# Patient Record
Sex: Male | Born: 1980 | Hispanic: Yes | Marital: Married | Smoking: Never smoker
Health system: Southern US, Community
[De-identification: ages and names within clinical notes are randomized; demographics above are authoritative.]

---

## 2000-05-25 ENCOUNTER — Emergency Department (HOSPITAL_COMMUNITY): Admission: EM | Admit: 2000-05-25 | Discharge: 2000-05-25 | Payer: Self-pay | Admitting: *Deleted

## 2011-07-07 ENCOUNTER — Ambulatory Visit: Payer: Self-pay | Admitting: Physician Assistant

## 2011-07-07 VITALS — BP 113/71 | HR 66 | Temp 99.0°F | Resp 16 | Ht 64.0 in | Wt 149.0 lb

## 2011-07-07 DIAGNOSIS — L255 Unspecified contact dermatitis due to plants, except food: Secondary | ICD-10-CM

## 2011-07-07 MED ORDER — PREDNISONE 10 MG PO TABS: ORAL_TABLET | ORAL | Status: DC

## 2011-07-07 MED ORDER — METHYLPREDNISOLONE ACETATE 80 MG/ML IJ SUSP
80.0000 mg | Freq: Once | INTRAMUSCULAR | Status: AC
  Administered 2011-07-07: 80 mg via INTRAMUSCULAR

## 2011-07-07 NOTE — Patient Instructions (Addendum)
If your rash gets bigger, more painful or you get fever, call or return to the clinic. You can use Claritin for itching Clean area with soap and water    Poison Ivy Poison ivy is a inflammation of the skin (contact dermatitis) caused by touching the allergens on the leaves of the ivy plant following previous exposure to the plant. The rash usually appears 48 hours after exposure. The rash is usually bumps (papules) or blisters (vesicles) in a linear pattern. Depending on your own sensitivity, the rash may simply cause redness and itching, or it may also progress to blisters which may break open. These must be well cared for to prevent secondary bacterial (germ) infection, followed by scarring. Keep any open areas dry, clean, dressed, and covered with an antibacterial ointment if needed. The eyes may also get puffy. The puffiness is worst in the morning and gets better as the day progresses. This dermatitis usually heals without scarring, within 2 to 3 weeks without treatment. HOME CARE INSTRUCTIONS  Thoroughly wash with soap and water as soon as you have been exposed to poison ivy. You have about one half hour to remove the plant resin before it will cause the rash. This washing will destroy the oil or antigen on the skin that is causing, or will cause, the rash. Be sure to wash under your fingernails as any plant resin there will continue to spread the rash. Do not rub skin vigorously when washing affected area. Poison ivy cannot spread if no oil from the plant remains on your body. A rash that has progressed to weeping sores will not spread the rash unless you have not washed thoroughly. It is also important to wash any clothes you have been wearing as these may carry active allergens. The rash will return if you wear the unwashed clothing, even several days later. Avoidance of the plant in the future is the best measure. Poison ivy plant can be recognized by the number of leaves. Generally, poison ivy  has three leaves with flowering branches on a single stem. Diphenhydramine may be purchased over the counter and used as needed for itching. Do not drive with this medication if it makes you drowsy.Ask your caregiver about medication for children. SEEK MEDICAL CARE IF:  Open sores develop.   Redness spreads beyond area of rash.   You notice purulent (pus-like) discharge.   You have increased pain.   Other signs of infection develop (such as fever).  Document Released: 12/30/1999 Document Revised: 12/21/2010 Document Reviewed: 11/17/2008 [facility] Patient Information 2012 [place], [place].

## 2011-07-08 NOTE — Progress Notes (Signed)
  Subjective:    Patient ID: Nicholas Simon, male    DOB: 06-Jan-1981, 31 y.o.   MRN: [id number]  HPI Nicholas Simon was working in his yard and came in contact with poison ivy. He had rashes on his forearms but they are better.  He now has a rash on his right medial lower leg for several days that is very red itchy and with small bumps.  It is not painful.  He applied a topical treatment but is not sure what it was.  He has had no fever or chills, streaking or drainage from the rash.   Review of Systems As noted in HPI, otherwise negative     Objective:   Physical Exam  Bilateral forearms with excoriated healing scattered linear lesions Right medial lower leg with large red area that is not indurated and tender with small papulovesicular lesions.      Assessment & Plan:  Contact dermatitis  Depo Medrol 80 mg IM now Prednisone 10 mg taper Claritin Watch for increasing redness, pain, streaking fever

## 2013-05-20 ENCOUNTER — Ambulatory Visit: Payer: Self-pay | Admitting: Family Medicine

## 2013-05-20 VITALS — BP 120/74 | HR 99 | Temp 98.3°F | Resp 16 | Ht 63.5 in | Wt 144.8 lb

## 2013-05-20 DIAGNOSIS — K4090 Unilateral inguinal hernia, without obstruction or gangrene, not specified as recurrent: Secondary | ICD-10-CM

## 2013-05-20 MED ORDER — TRAMADOL HCL 50 MG PO TABS: 50.0000 mg | ORAL_TABLET | Freq: Three times a day (TID) | ORAL | Status: AC | PRN

## 2013-05-20 NOTE — Progress Notes (Signed)
Subjective:    Patient ID: Nicholas Simon, male    DOB: 1980-02-10, 33 y.o.   MRN: [id number] This chart was scribed for Norberto SorensonEva Shaw, MD by Nicholos Johnsenise Iheanachor, Medical Scribe. This patient's care was started at 7:56 PM.  Chief Complaint  Patient presents with  . Groin Pain    x 2 weeks   Groin Pain The patient's primary symptoms include testicular pain. The patient's pertinent negatives include no penile pain or scrotal swelling. Pertinent negatives include no abdominal pain, chills, constipation, fever, nausea, rash or vomiting.   HPI Comments: Nicholas Simon is a 33 y.o. male who presents to [facility] complaining intermittent groin and right testicular pain, onset about 2 weeks ago. Pain worse with heavy lifting since sx onset. States he has to balance his weight to one side when lifting to avoid recreating pain. 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 States he had same pain 8 years ago and received medication that helped to resolve pain. Denies rash, nausea, vomiting, constipation, or abdominal pain.  Pt speaks some English. His daughter helped translate.  There are no active problems to display for this patient.  No past medical history on file. No past surgical history on file. No Known Allergies Prior to Admission medications   Medication Sig Start Date End Date Taking? Authorizing Provider  predniSONE (DELTASONE) 10 MG tablet 6,5,4,3,2,1 taper 07/07/11   Pattricia BossAlicia G Warrick, PA-C   History   Social History  . Marital Status: Married    Spouse Name: N/A    Number of Children: N/A  . Years of Education: N/A   Occupational History  . Not on file.   Social History Main Topics  . Smoking status: Current Every Day Smoker  . Smokeless tobacco: Not on file  . Alcohol Use: Yes  . Drug Use: No  . Sexual Activity: Not on file   Other Topics Concern  . Not on file   Social History Narrative  . No narrative on file   Review of Systems    Constitutional: Negative for fever, chills, activity change and appetite change.  Gastrointestinal: Negative for nausea, vomiting, abdominal pain and constipation.  Genitourinary: Positive for testicular pain. Negative for penile swelling, scrotal swelling, difficulty urinating and penile pain.  Skin: Negative for rash.   BP 120/74  Pulse 99  Temp(Src) 98.3 F (36.8 C) (Oral)  Resp 16  Ht 5' 3.5" (1.613 m)  Wt 144 lb 12.8 oz (65.681 kg)  BMI 25.24 kg/m2  SpO2 98% Objective:  Physical Exam  Vitals reviewed. Constitutional: He is oriented to person, place, and time. He appears well-developed and well-nourished. No distress.  HENT:  Head: Normocephalic and atraumatic.  Eyes: EOM are normal.  Neck: Neck supple.  Cardiovascular: Normal rate.   Pulmonary/Chest: Effort normal. No respiratory distress.  Abdominal: A hernia is present. Hernia confirmed positive in the right inguinal area. Hernia confirmed negative in the left inguinal area.  Genitourinary: Testes normal and penis normal. Right testis shows no mass, no swelling and no tenderness. No penile erythema.  Small right inguinal hernia palpable with valsava. Defect seems to be approx 1 cm.  Musculoskeletal: Normal range of motion.  Lymphadenopathy:    He has no cervical adenopathy.       Right: No inguinal adenopathy present.       Left: No inguinal adenopathy present.  Neurological: He is alert and oriented to person, place, and time.  Skin: Skin is warm and dry.  Psychiatric: He has a normal mood and affect. His behavior is normal.   Assessment & Plan:   Discussed with pt the best method to treat a hernia is through surgical repair. Unfortunately, pt w/o insurance so not interested in repair at this time.  Can try truss to alleviate sxs.  Warned of dangers of incarceration/strangluation - RTC or ER if occurs. Can call us for referral if he chooses to follow through with surgery or if pain worsens. Inguinal hernia  -  right  Meds ordered this encounter  Medications  . traMADol (ULTRAM) 50 MG tablet    Sig: Take 1 tablet (50 mg total) by mouth every 8 (eight) hours as needed.    Dispense:  30 tablet    Refill:  1    I personally performed the services described in this documentation, which was scribed in my presence. The recorded information has been reviewed and considered, and addended by me as needed.  Norberto SorensonEva Shaw, MD MPH

## 2013-05-20 NOTE — Patient Instructions (Addendum)
You may want to try using a hernia belt (also called a hernia truss) for your right inguinal hernia - you should probably be able to find one of these at [employer] or the pharmacy but can DEFINITELY find one on-line.  These can help prevent the pain when you are working by keeping the hernia from bulging out when you lift heavy things.  However, it will not make the hernia go away and normally overtime these hernias get worse - tend to grow larger and more painful. Surgery is the only way to fix it.  If it ever becomes VERY painful and doesn't get better when you lay down, make sure you come in to clinic immediately for evaluation as if the hernia gets stuck it is an emergency and could become life-threatening if not treated quickly.  You can use aleve or tylenol (generics are best) as needed for pain or use the prescription pain medication when pain is bad - be careful as pain medicine can make you drowsy or lightheaded.  Hernia inguinal - Adultos  (Inguinal Hernia, Adult)  Los msculos mantienen todos los rganos del cuerpo en Financial controllerel lugar correcto. Pero si se produce un punto dbil Valero Energyentre los msculos, algunos pueden protruir. Eso se llama hernia. Cuando esto sucede en la parte inferior del vientre (abdomen), se trata de una hernia inguinal. (Toma su nombre de una parte del cuerpo que en esta regin se llamada canal inguinal). Un punto dbil en la pared de los msculos deja que un poco de grasa o parte del intestino delgado salgan hacia afuera. Una hernia inguinal puede desarrollarse a cualquier edad. Los hombres la sufren con ms frecuencia que las mujeres.  CAUSAS  En los adultos, la hernia inguinal desarrolla con el tiempo.   Las causas pueden ser:  Un esfuerzo sbito de los msculos de la parte inferior del abdomen.  Levantar objetos pesados.  Dificultad para mover el intestino. La dificultad para mover el intestino (constipacin) puede llevar a una hernia.  Tos constante. La causa puede ser el  tabaquismo o una enfermedad pulmonar.  Tener sobrepeso.  El Carrolltonembarazo.  Tener un empleo que requiera Location managerpermanecer largos perodos de pie o levantar objetos pesados.  Haber sufrido de una hernia inguinal anteriormente. En algunos casos puede convertirse en una situacin de Associate Professoremergencia. Cuando esto ocurre, se llama hernia inguinal estrangulada. Se produce cuando una parte del intestino delgado se desliza a travs del punto dbil y no puede volver al abdomen. El flujo de Alvinsangre puede interrumpirse. Si esto ocurre, una parte del intestino puede morir. Esta situacin requiere [place] Herz[place]una ciruga de Luxembourgurgencia.  SNTOMAS  Generalmente una hernia inguinal pequea no tiene sntomas. Se diagnostica cuando un profesional de la salud hace un examen fsico. Las hernias ms grandes generalmente presentan sntomas.   En los adultos, los sntomas incluyen:  Un bulto en la ingle. Es fcil de Engineer, manufacturingdetectar cuando la persona est de pie. Puede desaparecer al Javier Glazierestar acostado.  Los hombres pueden tener un bulto Proofreaderen el escroto.  Dolor o ardor en la ingle. Esto ocurre especialmente al levantar objetos, realizar un esfuerzo o toser.  Dolor sordo o sensacin de presin en la ingle.  Los signos de una hernia estrangulada pueden ser:  Neomia DearUna protuberancia en la ingle que duele mucho y est sensible al tacto.  Un bulto que se vuelve de color rojo o prpura.  Grant RutsFiebre, nuseas y vmitos.  Imposibilidad de evacuar el intestino o de eliminar gases. DIAGNSTICO  Para diagnosticar una hernia inguinal, el  profesional le har un examen fsico.   Incluir preguntas acerca de los sntomas que haya notado.  El mdico palpar el rea de la ingle y le pedir que tosa. Si palpa una hernia inguinal, el mdico podr tratar de deslizarla de nuevo hacia adentro el abdomen.  Por lo general no se necesitan otros estudios. TRATAMIENTO  Los tratamientos IT consultantpueden variar. Dependern del tamao de la hernia. Las opciones incluyen:   Observacin  cuidadosa. Esto a menudo se sugiere si la hernia es pequea y usted no ha tenido sntomas.  No se realizar ningn procedimiento mdico excepto que aparezcan sntomas.  Tendr que prestar atencin a los sntomas. Si tiene sntomas, comunquese con su mdico de inmediato.  Ciruga. Se realiza si la hernia es grande o si tiene sntomas.  Ciruga abierta. Por lo general, este es un procedimiento ambulatorio (no tendr que pasar la noche en el hospital). Se realiza un corte (incisin) a travs de la piel de la ingle. La hernia se vuelve a colocar en el interior del abdomen. Luego se repara la zona dbil en los msculos con una herniorrafia o hernioplastia. Herniorrafa: en este tipo de Azerbaijanciruga, se suturan juntos los msculos dbiles. Hernioplasta: se coloca un parche o malla para cerrar el rea dbil en la pared abdominal.  Laparoscopia. En este procedimiento, el cirujano hace incisiones pequeas. Se coloca en el abdomen un tubo delgado con una pequea cmara de video (llamado laparoscopio). El cirujano repara la hernia con Pagelanduna malla, observando en una cmara de vdeo y [address]izando dos instrumentos largos. INSTRUCCIONES PARA EL CUIDADO EN EL HOGAR   Despus de la ciruga de reparacin de una hernia inguinal:  Necesitar tomar un analgsico para el dolor recetado por su mdico. Siga cuidadosamente todas las indicaciones.  Tendr que cuidar la herida de la incisin.  Deber restringir algunas actividades por un tiempo. Incluir no levantar objetos pesados   durante varias semanas. Tampoco podr hacer nada demasiado activo durante algunas semanas. La vuelta al Aleen Campitrabajo depender del tipo de trabajo que tenga.  Durante perodos de "espera vigilante", usted debe:  Mantenga un peso saludable.  Consumir una dieta rica en fibra (frutas, verduras y granos enteros).  Beba gran cantidad de lquidos para evitar la constipacin. Esto significa beber suficiente agua y otros lquidos para mantener la orina  clara o de color amarillo plido.  No levante objetos pesados.  No permanezca de pie durante largos perodos.  Deje de fumar. Evite toser con frecuencia. SOLICITE ATENCIN MDICA SI:   Aparece una protuberancia en el rea de la ingle.  Siente dolor, tiene sensacin de Livingstonquemazn o de presin en la ingle. Esto podra empeorar si levanta pesos o hace esfuerzos.  Tiene fiebre de ms de 100.5 F (38.1 C). SOLICITE ATENCIN MDICA DE INMEDIATO SI:   El dolor en la ingle aumenta repentinamente.  Una protuberancia en la ingle se hace ms grande y no baja.  En los hombres, un dolor repentino en el escroto. O el escroto aumenta de tamao.  Un bulto en el rea de la ingle se vuelve de color rojo o prpura y es dolorosa al tacto.  Tiene nuseas o vmitos que no desaparecen.  Siente que su corazn late mucho ms rpido de lo normal.  No puede mover el intestino o eliminar gases.  Tiene fiebre de ms de 102.0 F (38.9 C). Document Released: 04/28/2012 [facility] Patient Information 2014 [place], [place].

## 2015-04-01 ENCOUNTER — Ambulatory Visit (INDEPENDENT_AMBULATORY_CARE_PROVIDER_SITE_OTHER): Payer: Self-pay

## 2015-04-01 ENCOUNTER — Ambulatory Visit (INDEPENDENT_AMBULATORY_CARE_PROVIDER_SITE_OTHER): Payer: Self-pay | Admitting: Family Medicine

## 2015-04-01 VITALS — BP 120/80 | HR 62 | Temp 98.5°F | Resp 18 | Ht 64.25 in | Wt 154.8 lb

## 2015-04-01 DIAGNOSIS — R1030 Lower abdominal pain, unspecified: Secondary | ICD-10-CM

## 2015-04-01 DIAGNOSIS — R1031 Right lower quadrant pain: Secondary | ICD-10-CM

## 2015-04-01 LAB — POCT URINALYSIS DIP (MANUAL ENTRY)
Bilirubin, UA: NEGATIVE
Blood, UA: NEGATIVE
GLUCOSE UA: NEGATIVE
Ketones, POC UA: NEGATIVE
Leukocytes, UA: NEGATIVE
NITRITE UA: NEGATIVE
PROTEIN UA: NEGATIVE
Spec Grav, UA: 1.01
UROBILINOGEN UA: 0.2
pH, UA: 7

## 2015-04-01 LAB — POCT CBC
Granulocyte percent: 61.3 %G (ref 37–80)
HCT, POC: 42.8 % — AB (ref 43.5–53.7)
Hemoglobin: 16.3 g/dL (ref 14.1–18.1)
LYMPH, POC: 2 (ref 0.6–3.4)
MCH, POC: 32.7 pg — AB (ref 27–31.2)
MCHC: 38 g/dL — AB (ref 31.8–35.4)
MCV: 85.9 fL (ref 80–97)
MID (CBC): 0.5 (ref 0–0.9)
MPV: 6.7 fL (ref 0–99.8)
PLATELET COUNT, POC: 221 10*3/uL (ref 142–424)
POC Granulocyte: 3.9 (ref 2–6.9)
POC LYMPH %: 30.5 % (ref 10–50)
POC MID %: 8.2 %M (ref 0–12)
RBC: 4.97 M/uL (ref 4.69–6.13)
RDW, POC: 12.4 %
WBC: 6.4 10*3/uL (ref 4.6–10.2)

## 2015-04-01 LAB — POC MICROSCOPIC URINALYSIS (UMFC): MUCUS RE: ABSENT

## 2015-04-01 NOTE — Progress Notes (Signed)
Patient ID: Nicholas Simon, male    DOB: December 06, 1980  Age: 34 y.o. MRN: [id number]  Chief Complaint  Patient presents with  . Groin Pain    Right side x1 month on and off.     Subjective:   2 years ago the patient was here with right groin pain, thought to have a very tiny inguinal hernia. Things did not bother him for the last couple of years much until the last month. Having pain in the right lower abdomen just between McBurney's point in the umbilicus. He said the pain radiates down to the right side of the groin or right upper medial thigh. He is a Education administrator, does a good deal of physical activity, but with careful lifting and straining most of the time it is been okay. Over the last week it has gradually gotten worse. No fever. No nausea or vomiting. No problems with his bowels or urine.  Current allergies, medications, problem list, past/family and social histories reviewed.  Objective:  BP 120/80 mmHg  Pulse 62  Temp(Src) 98.5 F (36.9 C) (Oral)  Resp 18  Ht 5' 4.25" (1.632 m)  Wt 154 lb 12.8 oz (70.217 kg)  BMI 26.36 kg/m2  SpO2 100%  No major distress. Abdomen is soft without masses. Is tender just above right and below the umbilicus. The pain seems consistent on palpation. No tenderness in the groin. Mild weakness of the right inguinal ring. To the left but no true hernia. Testicle appears normal. Normal male uncircumcised external genitalia. Thigh nontender.  Assessment & Plan:   Assessment: 1. RLQ abdominal pain   2. Right groin pain       Plan: CBC, urine, and x-rays  Orders Placed This Encounter  Procedures  . DG Abd 2 Views    Order Specific Question:  Reason for Exam (SYMPTOM  OR DIAGNOSIS REQUIRED)    Answer:  rlq pain medial to mcburney's point past month    Order Specific Question:  Preferred imaging location?    Answer:  External  . POCT CBC  . POCT urinalysis dipstick  . POCT Microscopic Urinalysis ([facility])   Results for orders placed or performed in  visit on 04/01/15  POCT CBC  Result Value Ref Range   WBC 6.4 4.6 - 10.2 K/uL   Lymph, poc 2.0 0.6 - 3.4   POC LYMPH PERCENT 30.5 10 - 50 %L   MID (cbc) 0.5 0 - 0.9   POC MID % 8.2 0 - 12 %M   POC Granulocyte 3.9 2 - 6.9   Granulocyte percent 61.3 37 - 80 %G   RBC 4.97 4.69 - 6.13 M/uL   Hemoglobin 16.3 14.1 - 18.1 g/dL   HCT, POC 40.9 (A) 81.1 - 53.7 %   MCV 85.9 80 - 97 fL   MCH, POC 32.7 (A) 27 - 31.2 pg   MCHC 38.0 (A) 31.8 - 35.4 g/dL   RDW, POC 91.4 %   Platelet Count, POC 221 142 - 424 K/uL   MPV 6.7 0 - 99.8 fL  POCT urinalysis dipstick  Result Value Ref Range   Color, UA yellow yellow   Clarity, UA clear clear   Glucose, UA negative negative   Bilirubin, UA negative negative   Ketones, POC UA negative negative   Spec Grav, UA 1.010    Blood, UA negative negative   pH, UA 7.0    Protein Ur, POC negative negative   Urobilinogen, UA 0.2    Nitrite, UA Negative Negative  Leukocytes, UA Negative Negative  POCT Microscopic Urinalysis ([facility])  Result Value Ref Range   WBC,UR,HPF,POC None None WBC/hpf   RBC,UR,HPF,POC None None RBC/hpf   Bacteria None None, Too numerous to count   Mucus Absent Absent   Epithelial Cells, UR Per Microscopy None None, Too numerous to count cells/hpf   X-ray of abdomen appears normal. Radiology reading is pending. No orders of the defined types were placed in this encounter.    He is having pain, but I cannot find a distinct etiology. If he keeps having pain we will need to do further evaluation with a CT scan of the abdomen and pelvis. He is to return in about 3 days if not improving.     Patient Instructions   It is unclear why you keep having pain in that area. It may be because you are a little constipated (stool may be backed up on you).  Take MiraLAX 1 dose daily until stools are on the loose side  If pain continues to persist over the next 3 or 4 days we will probably need to do a CT scan of your abdomen and pelvis. If  you're getting at all worse at any time please come in sooner.    IF you received an x-ray today, you will receive an invoice from [facility]. Please contact [facility] at [phone] with questions or concerns regarding your invoice.   IF you received labwork today, you will receive an invoice from [facility]. Please contact [facility] at [phone] with questions or concerns regarding your invoice.   Our billing staff will not be able to assist you with questions regarding bills from these companies.  You will be contacted with the lab results as soon as they are available. The fastest way to get your results is to activate your My Chart account. Instructions are located on the last page of this paperwork. If you have not heard from us regarding the results in 2 weeks, please contact this office.          No Follow-up on file.   Layia Walla, MD 04/01/2015

## 2015-04-01 NOTE — Patient Instructions (Addendum)
It is unclear why you keep having pain in that area. It may be because you are a little constipated (stool may be backed up on you).  Take MiraLAX 1 dose daily until stools are on the loose side  If pain continues to persist over the next 3 or 4 days we will probably need to do a CT scan of your abdomen and pelvis. If you're getting at all worse at any time please come in sooner.    IF you received an x-ray today, you will receive an invoice from [facility]. Please contact [facility] at [phone] with questions or concerns regarding your invoice.   IF you received labwork today, you will receive an invoice from [facility]. Please contact [facility] at [phone] with questions or concerns regarding your invoice.   Our billing staff will not be able to assist you with questions regarding bills from these companies.  You will be contacted with the lab results as soon as they are available. The fastest way to get your results is to activate your My Chart account. Instructions are located on the last page of this paperwork. If you have not heard from us regarding the results in 2 weeks, please contact this office.

## 2015-04-08 ENCOUNTER — Telehealth: Payer: Self-pay

## 2015-04-08 NOTE — Telephone Encounter (Signed)
Pt  Has questions about tramadal not sure exactly what he needs hard to understand language barrier  Best number [phone]

## 2015-04-12 NOTE — Telephone Encounter (Signed)
Pt states he did not get Tramadol. Were you wanting to Rx this?

## 2015-04-14 NOTE — Telephone Encounter (Signed)
LM advising pt to RTC.

## 2015-04-14 NOTE — Telephone Encounter (Signed)
Left message for pt to call back  °

## 2015-04-14 NOTE — Telephone Encounter (Signed)
Call, If still having pains he needs to come back for a recheck to decide if a scan is needed.  I do not think it is wise to just give pain pills to hide the pain without further assessment. Have patient return to be seen by one of us.

## 2015-06-27 ENCOUNTER — Ambulatory Visit (INDEPENDENT_AMBULATORY_CARE_PROVIDER_SITE_OTHER): Payer: Self-pay | Admitting: Physician Assistant

## 2015-06-27 VITALS — BP 118/70 | HR 64 | Temp 98.0°F | Resp 18 | Ht 64.5 in | Wt 154.0 lb

## 2015-06-27 DIAGNOSIS — N50819 Testicular pain, unspecified: Secondary | ICD-10-CM

## 2015-06-27 LAB — POCT URINALYSIS DIP (MANUAL ENTRY)
BILIRUBIN UA: NEGATIVE
BILIRUBIN UA: NEGATIVE
Blood, UA: NEGATIVE
Glucose, UA: NEGATIVE
Leukocytes, UA: NEGATIVE
Nitrite, UA: NEGATIVE
PH UA: 5.5
PROTEIN UA: NEGATIVE
SPEC GRAV UA: 1.025
Urobilinogen, UA: 0.2

## 2015-06-27 LAB — POC MICROSCOPIC URINALYSIS (UMFC): MUCUS RE: ABSENT

## 2015-06-27 NOTE — Progress Notes (Signed)
   06/27/2015 12:35 PM   DOB: 1980/09/18 / MRN: [id number]  SUBJECTIVE:  Nicholas Simon is a 35 y.o. male presenting for   He has No Known Allergies.   He  has no past medical history on file.    He  reports that he has been smoking.  He does not have any smokeless tobacco history on file. He reports that he drinks alcohol. He reports that he does not use illicit drugs. He  has no sexual activity history on file. The patient  has no past surgical history on file.  His family history is not on file.  ROS  Problem list and medications reviewed and updated by myself where necessary, and exist elsewhere in the encounter.   OBJECTIVE:  BP 118/70 mmHg  Pulse 64  Temp(Src) 98 F (36.7 C) (Oral)  Resp 18  Ht 5' 4.5" (1.638 m)  Wt 154 lb (69.854 kg)  BMI 26.04 kg/m2  SpO2 100%  Physical Exam  No results found for this or any previous visit (from the past 72 hour(s)).  No results found.  ASSESSMENT AND PLAN  There are no diagnoses linked to this encounter.  The patient was advised to call or return to clinic if he does not see an improvement in symptoms or to seek the care of the closest emergency department if he worsens with the above plan.   Deliah BostonMichael Sholom Dulude, MHS, PA-C [facility] 06/27/2015 12:35 PM

## 2015-06-27 NOTE — Progress Notes (Signed)
06/27/2015 1:15 PM   DOB: [birth year] / MRN: [id number]  SUBJECTIVE:  Nicholas Simon is a 35 y.o. male presenting for right sided testicular irritation for the last 3 months.  Reports that he can feel "a bump on my egg." He denies dysuria, urgency, frequency, penile discharge. No new partners.   Denies weight loss, night sweats, fever, and is eating well at this time.  He is a self pay patient.    He has No Known Allergies.   He  has no past medical history on file.    He  reports that he has been smoking.  He does not have any smokeless tobacco history on file. He reports that he drinks alcohol. He reports that he does not use illicit drugs. He  has no sexual activity history on file. The patient  has no past surgical history on file.  His family history is not on file.  Review of Systems  Constitutional: Negative for fever, chills and diaphoresis.  Skin: Negative for rash.  Neurological: Negative for headaches.    Problem list and medications reviewed and updated by myself where necessary, and exist elsewhere in the encounter.   OBJECTIVE:  BP 118/70 mmHg  Pulse 64  Temp(Src) 98 F (36.7 C) (Oral)  Resp 18  Ht 5' 4.5" (1.638 m)  Wt 154 lb (69.854 kg)  BMI 26.04 kg/m2  SpO2 100%  Physical Exam  Constitutional: He is oriented to person, place, and time. He appears well-developed. He does not appear ill.  Eyes: Conjunctivae and EOM are normal. Pupils are equal, round, and reactive to light.  Cardiovascular: Normal rate and regular rhythm.   Pulmonary/Chest: Effort normal and breath sounds normal.  Abdominal: He exhibits no distension.  Genitourinary:     Musculoskeletal: Normal range of motion.  Neurological: He is alert and oriented to person, place, and time. No cranial nerve deficit. Coordination normal.  Skin: Skin is warm and dry. He is not diaphoretic.  Psychiatric: He has a normal mood and affect.  Nursing note and vitals reviewed.   Results for orders placed  or performed in visit on 06/27/15 (from the past 72 hour(s))  POCT urinalysis dipstick     Status: None   Collection Time: 06/27/15  1:12 PM  Result Value Ref Range   Color, UA yellow yellow   Clarity, UA clear clear   Glucose, UA negative negative   Bilirubin, UA negative negative   Ketones, POC UA negative negative   Spec Grav, UA 1.025    Blood, UA negative negative   pH, UA 5.5    Protein Ur, POC negative negative   Urobilinogen, UA 0.2    Nitrite, UA Negative Negative   Leukocytes, UA Negative Negative  POCT Microscopic Urinalysis ([facility])     Status: None   Collection Time: 06/27/15  1:12 PM  Result Value Ref Range   WBC,UR,HPF,POC None None WBC/hpf   RBC,UR,HPF,POC None None RBC/hpf   Bacteria None None, Too numerous to count   Mucus Absent Absent   Epithelial Cells, UR Per Microscopy None None, Too numerous to count cells/hpf    No results found.  ASSESSMENT AND PLAN  [name] was seen today for groin pain.  Diagnoses and all orders for this visit:  Testicular discomfort: He is concerned and has had a similar complaint in the past. Will obtain and non stat US to verify there is no abnormality.  -     POCT urinalysis dipstick -     POCT  Microscopic Urinalysis ([facility]) -     US Art/Ven Flow Abd Pelv Doppler; Future -     US Scrotum; Future    The patient was advised to call or return to clinic if he does not see an improvement in symptoms or to seek the care of the closest emergency department if he worsens with the above plan.   Deliah BostonMichael Alger Kerstein, MHS, PA-C [facility] 06/27/2015 1:15 PM

## 2015-06-27 NOTE — Patient Instructions (Signed)
     IF you received an x-ray today, you will receive an invoice from [facility]. Please contact [facility] at [phone] with questions or concerns regarding your invoice.   IF you received labwork today, you will receive an invoice from [facility]. Please contact [facility] at [phone] with questions or concerns regarding your invoice.   Our billing staff will not be able to assist you with questions regarding bills from these companies.  You will be contacted with the lab results as soon as they are available. The fastest way to get your results is to activate your My Chart account. Instructions are located on the last page of this paperwork. If you have not heard from us regarding the results in 2 weeks, please contact this office.      

## 2015-07-05 ENCOUNTER — Ambulatory Visit
Admission: RE | Admit: 2015-07-05 | Discharge: 2015-07-05 | Disposition: A | Discharge status: Home / Self Care | Payer: No Typology Code available for payment source | Source: Ambulatory Visit | Attending: Physician Assistant | Admitting: Physician Assistant

## 2015-07-05 DIAGNOSIS — N50819 Testicular pain, unspecified: Secondary | ICD-10-CM

## 2015-07-11 ENCOUNTER — Other Ambulatory Visit: Payer: Self-pay | Admitting: Physician Assistant

## 2015-07-11 DIAGNOSIS — N5089 Other specified disorders of the male genital organs: Secondary | ICD-10-CM

## 2017-01-22 IMAGING — US US ART/VEN ABD/PELV/SCROTUM DOPPLER LTD
1 series · 14 of 25 positions shown · non-contrast
Comparison: None in PACs

CLINICAL DATA: Testicular discomfort and irritation and right
scrotal mass

EXAM:
SCROTAL ULTRASOUND
DOPPLER ULTRASOUND OF THE TESTICLES
TECHNIQUE: Complete ultrasound examination of the testicles, epididymis, and
other scrotal structures was performed. Color and spectral Doppler
ultrasound were also utilized to evaluate blood flow to the
testicles.

[Series 1: us art/ven abd/pelv/scrotum doppler ltd · 0.06mm/px · 14 of 51 slices shown]
[im 1/51]
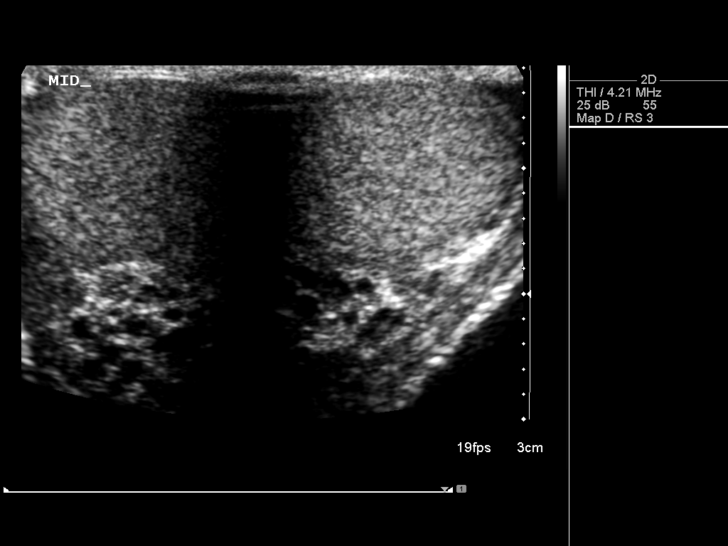
[im 5/51]
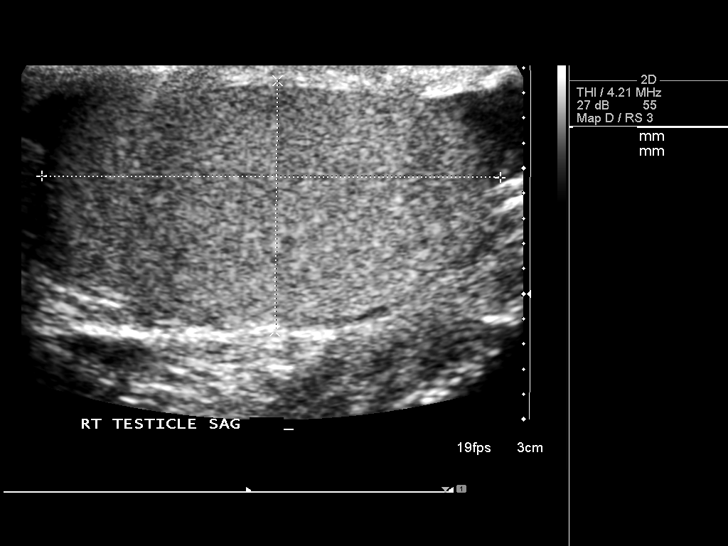
[im 9/51]
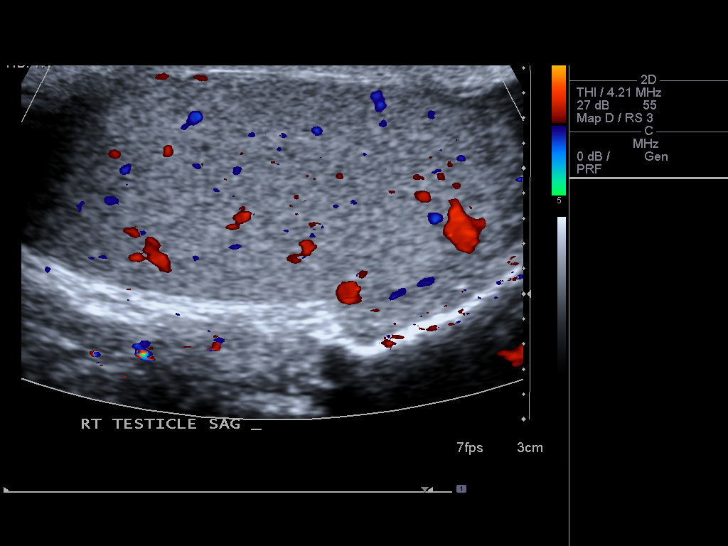
[im 13/51]
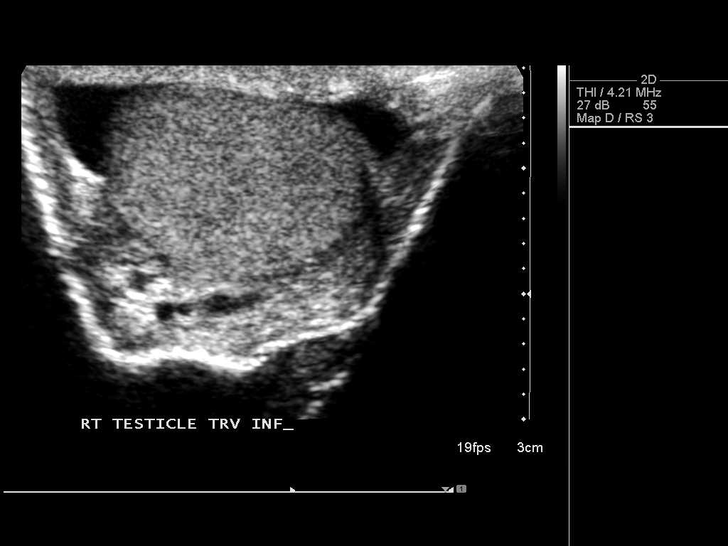
[im 17/51]
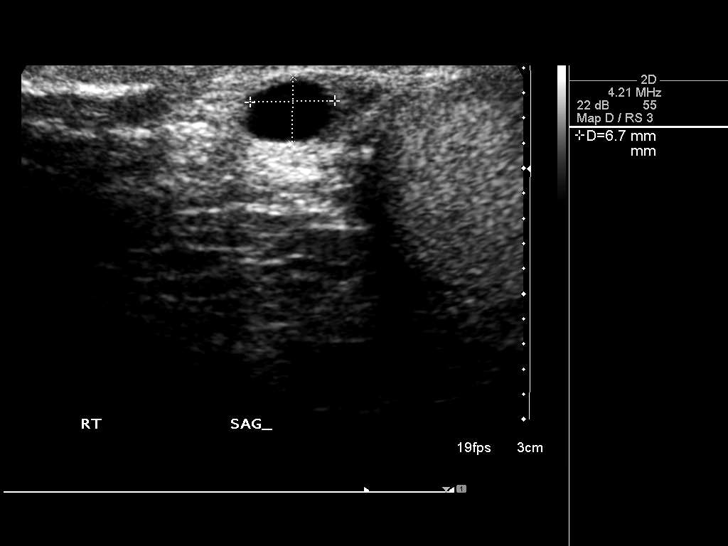
[im 19/51]
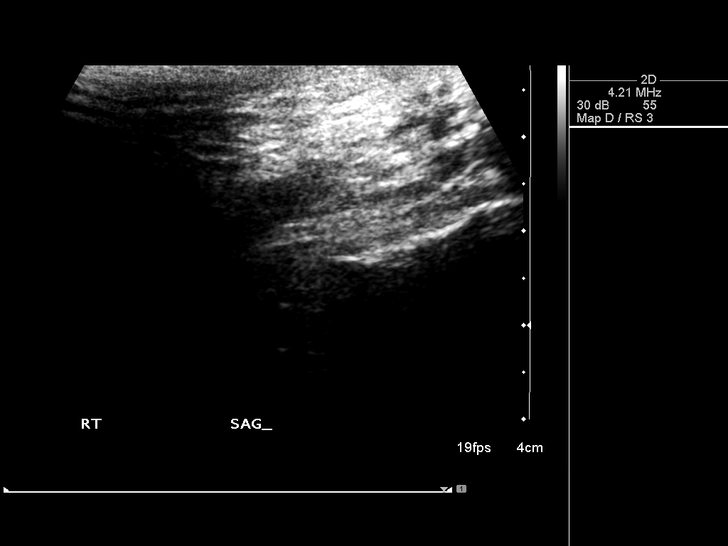
[im 23/51]
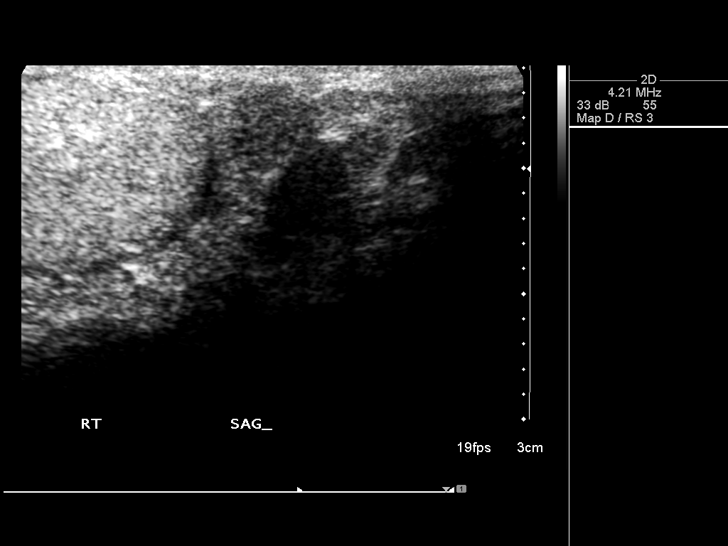
[im 28/51]
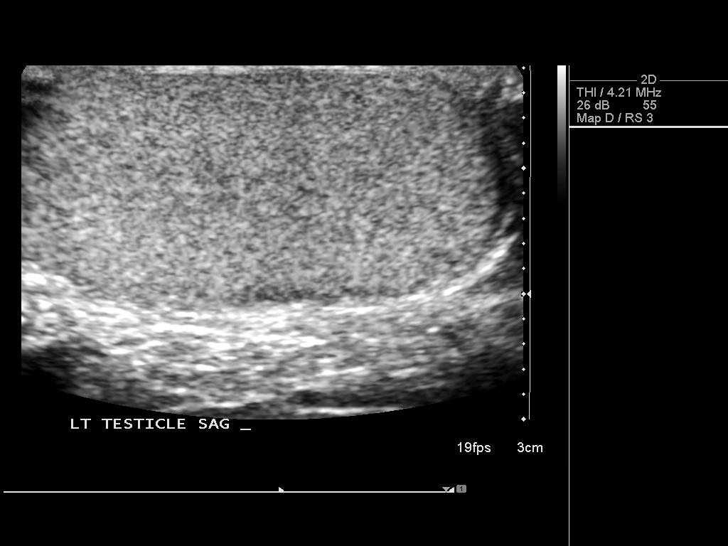
[im 32/51]
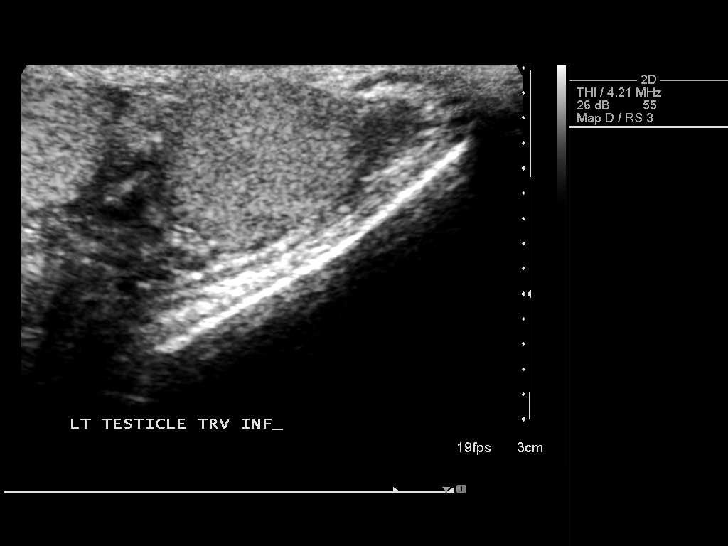
[im 34/51]
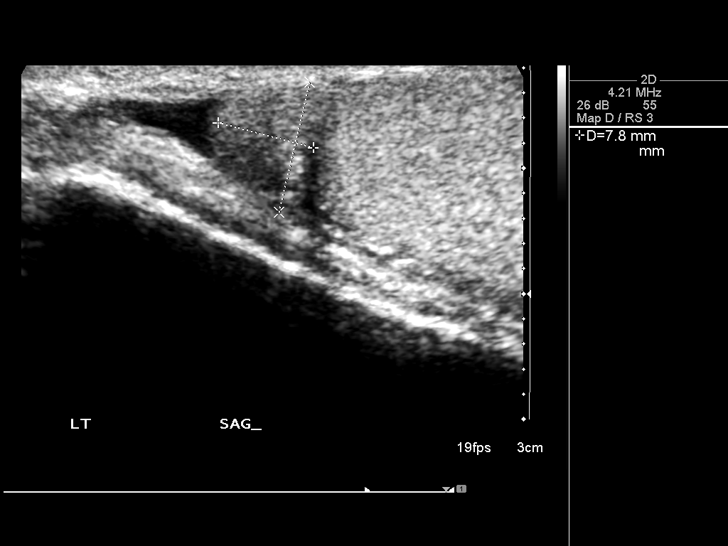
[im 38/51]
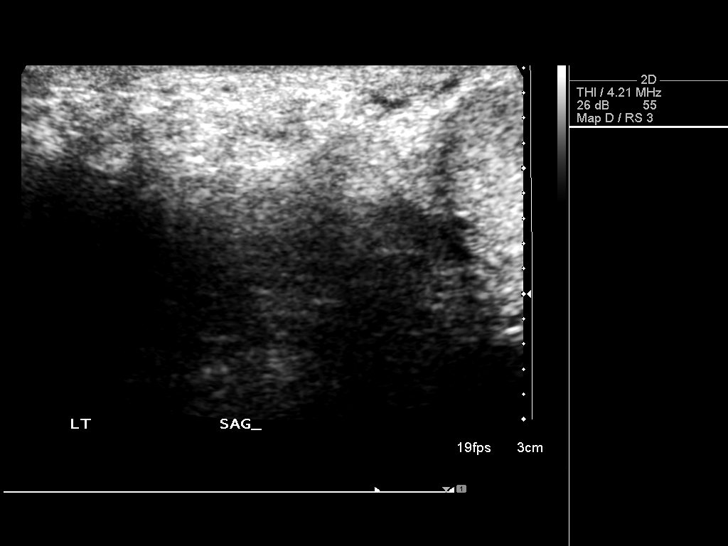
[im 42/51]
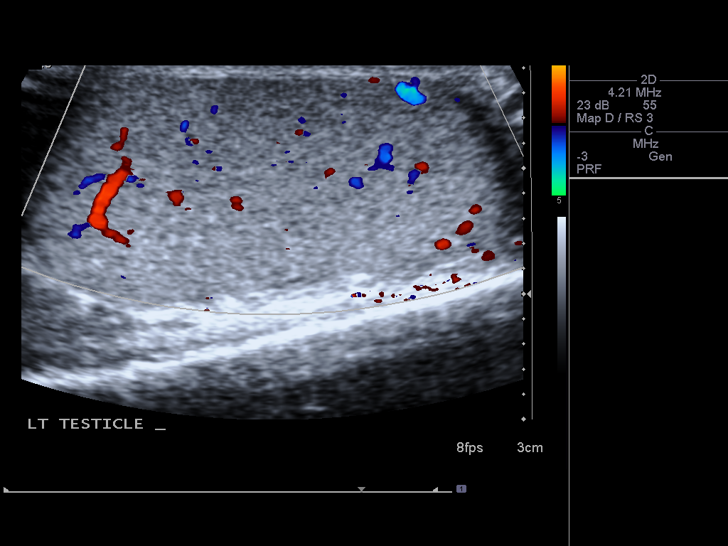
[im 46/51]
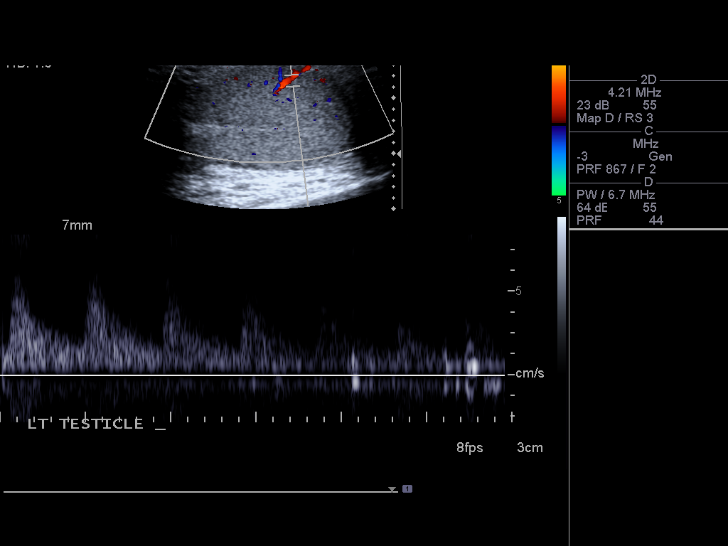
[im 51/51]
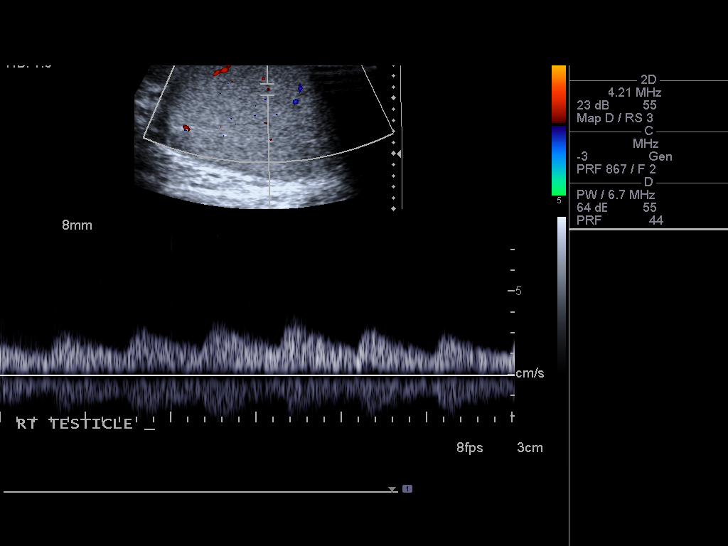

[14 of 25 positions shown; findings below may reference images not displayed]

FINDINGS: Right testicle

Measurements: 3.7 x 2.0 x 2.5 cm. No mass or microlithiasis
visualized.

Left testicle

Measurements: 3.7 x 1.7 x 2.6 cm. No mass or microlithiasis
visualized.

Right epididymis:  There is a 7 mm diameter right epididymal cyst.

Left epididymis:  Normal in size and appearance.

Hydrocele:  None visualized.

Varicocele:  None visualized.

Pulsed Doppler interrogation of both testes demonstrates normal low
resistance arterial and venous waveforms bilaterally.
IMPRESSION: 1. No evidence of a testicular mass, torsion, or acute inflammation.
2. 7 mm right epididymal cyst without evidence of acute
epididymitis.
3. No hydrocele or varicocele.

## 2018-03-27 ENCOUNTER — Ambulatory Visit
Admission: EM | Admit: 2018-03-27 | Discharge: 2018-03-27 | Disposition: A | Discharge status: Home / Self Care | Payer: Self-pay | Attending: Family Medicine | Admitting: Family Medicine

## 2018-03-27 DIAGNOSIS — R69 Illness, unspecified: Principal | ICD-10-CM

## 2018-03-27 DIAGNOSIS — R05 Cough: Secondary | ICD-10-CM

## 2018-03-27 DIAGNOSIS — J111 Influenza due to unidentified influenza virus with other respiratory manifestations: Secondary | ICD-10-CM

## 2018-03-27 DIAGNOSIS — R52 Pain, unspecified: Secondary | ICD-10-CM

## 2018-03-27 DIAGNOSIS — R509 Fever, unspecified: Secondary | ICD-10-CM

## 2018-03-27 MED ORDER — IBUPROFEN 600 MG PO TABS: 600.0000 mg | ORAL_TABLET | Freq: Four times a day (QID) | ORAL | 0 refills | Status: AC | PRN

## 2018-03-27 MED ORDER — CETIRIZINE HCL 10 MG PO CAPS: 10.0000 mg | ORAL_CAPSULE | Freq: Every day | ORAL | 0 refills | Status: AC

## 2018-03-27 MED ORDER — FLUTICASONE PROPIONATE 50 MCG/ACT NA SUSP: 1.0000 | Freq: Every day | NASAL | 2 refills | Status: AC

## 2018-03-27 MED ORDER — BENZONATATE 200 MG PO CAPS: 200.0000 mg | ORAL_CAPSULE | Freq: Three times a day (TID) | ORAL | 0 refills | Status: AC | PRN

## 2018-03-27 NOTE — Discharge Instructions (Signed)
Symptoms most likely flu, symptoms should resolve in approximately 1 week. Follow up if not improving, fever persisting or developing worsening cough, shortness of breath  1. Take a daily allergy pill/anti-histamine like Zyrtec, Claritin, or Store brand consistently for 2 weeks  2. For congestion you may try an oral decongestant like Mucinex or sudafed. You may also try intranasal flonase nasal spray or saline irrigations (neti pot, sinus cleanse)  3. For your sore throat you may try cepacol lozenges, salt water gargles, throat spray. Treatment of congestion may also help your sore throat.  4. For cough you may try Tessalon provided or over the counter Delsym, Robitussen, Mucinex DM  5. Take Tylenol or Ibuprofen to help with pain/inflammation  6. Stay hydrated, drink plenty of fluids to keep throat coated and less irritated  Honey Tea For cough/sore throat try using a honey-based tea. Use 3 teaspoons of honey with juice squeezed from half lemon. Place shaved pieces of ginger into 1/2-1 cup of water and warm over stove top. Then mix the ingredients and repeat every 4 hours as needed.

## 2018-03-27 NOTE — ED Triage Notes (Signed)
Pt c/o cold, congestion, body aches and fever x2days

## 2018-03-28 NOTE — ED Provider Notes (Signed)
[facility]    CSN: [id number] Arrival date & time: 03/27/18  1303     History   Chief Complaint Chief Complaint  Patient presents with  . Influenza    HPI Nicholas Simon is a 38 y.o. male notes no past medical history presenting today for evaluation of fever and URI symptoms.  Patient states that over the past 2 days he has developed fever, cough, congestion and body aches.  Symptoms began Tuesday.  He has tried ibuprofen, last dose yesterday.  He is also tried a nighttime cold and flu medicine.  Denies nausea vomiting or diarrhea.Denies any recent travel, denies any known exposure to COVID-19.  Denies any known exposures to flu.  HPI  History reviewed. No pertinent past medical history.  There are no active problems to display for this patient.   History reviewed. No pertinent surgical history.     Home Medications    Prior to Admission medications   Medication Sig Start Date End Date Taking? Authorizing Provider  benzonatate (TESSALON) 200 MG capsule Take 1 capsule (200 mg total) by mouth 3 (three) times daily as needed for up to 7 days for cough. 03/27/18 04/03/18  Wieters, Hallie C, PA-C  Cetirizine HCl 10 MG CAPS Take 1 capsule (10 mg total) by mouth daily for 10 days. 03/27/18 04/06/18  Wieters, Hallie C, PA-C  fluticasone (FLONASE) 50 MCG/ACT nasal spray Place 1-2 sprays into both nostrils daily. 03/27/18   Wieters, Hallie C, PA-C  ibuprofen (ADVIL,MOTRIN) 600 MG tablet Take 1 tablet (600 mg total) by mouth every 6 (six) hours as needed. 03/27/18   Wieters, Hallie C, PA-C  traMADol (ULTRAM) 50 MG tablet Take 1 tablet (50 mg total) by mouth every 8 (eight) hours as needed. Patient not taking: Reported on 04/01/2015 05/20/13   Sherren Mocha, MD    Family History No family history on file.  Social History Social History   Tobacco Use  . Smoking status: Never Smoker  . Smokeless tobacco: Never Used  Substance Use Topics  . Alcohol use: Yes  . Drug use: No      Allergies   Patient has no known allergies.   Review of Systems Review of Systems  Constitutional: Positive for chills, fatigue and fever. Negative for activity change and appetite change.  HENT: Positive for congestion and rhinorrhea. Negative for ear pain, sinus pressure, sore throat and trouble swallowing.   Eyes: Negative for discharge and redness.  Respiratory: Positive for cough. Negative for chest tightness and shortness of breath.   Cardiovascular: Negative for chest pain.  Gastrointestinal: Negative for abdominal pain, diarrhea, nausea and vomiting.  Musculoskeletal: Positive for myalgias.  Skin: Negative for rash.  Neurological: Negative for dizziness, light-headedness and headaches.     Physical Exam Triage Vital Signs ED Triage Vitals  Enc Vitals Group     BP 03/27/18 1344 106/74     Pulse Rate 03/27/18 1344 89     Resp 03/27/18 1344 18     Temp 03/27/18 1344 99.6 F (37.6 C)     Temp Source 03/27/18 1344 Oral     SpO2 03/27/18 1344 96 %     Weight --      Height --      Head Circumference --      Peak Flow --      Pain Score 03/27/18 1345 0     Pain Loc --      Pain Edu? --      Excl. in  GC? --    No data found.  Updated Vital Signs BP 106/74 (BP Location: Left Arm)   Pulse 89   Temp 99.6 F (37.6 C) (Oral)   Resp 18   SpO2 96%   Visual Acuity Right Eye Distance:   Left Eye Distance:   Bilateral Distance:    Right Eye Near:   Left Eye Near:    Bilateral Near:     Physical Exam Vitals signs and nursing note reviewed.  Constitutional:      Appearance: He is well-developed.  HENT:     Head: Normocephalic and atraumatic.     Ears:     Comments: Bilateral ears without tenderness to palpation of external auricle, tragus and mastoid, EAC's without erythema or swelling, TM's with good bony landmarks and cone of light. Non erythematous.     Nose:     Comments: Slightly erythematous nasal mucosa with swollen turbinates    Mouth/Throat:      Comments: Oral mucosa pink and moist, no tonsillar enlargement or exudate. Posterior pharynx patent and nonerythematous, no uvula deviation or swelling. Normal phonation.  Eyes:     Conjunctiva/sclera: Conjunctivae normal.  Neck:     Musculoskeletal: Neck supple.  Cardiovascular:     Rate and Rhythm: Normal rate and regular rhythm.     Heart sounds: No murmur.  Pulmonary:     Effort: Pulmonary effort is normal. No respiratory distress.     Breath sounds: Normal breath sounds.     Comments: Breathing comfortably at rest, CTABL, no wheezing, rales or other adventitious sounds auscultated  Abdominal:     Palpations: Abdomen is soft.     Tenderness: There is no abdominal tenderness.  Skin:    General: Skin is warm and dry.  Neurological:     Mental Status: He is alert.      UC Treatments / Results  Labs (all labs ordered are listed, but only abnormal results are displayed) Labs Reviewed - No data to display  EKG None  Radiology No results found.  Procedures Procedures (including critical care time)  Medications Ordered in UC Medications - No data to display  Initial Impression / Assessment and Plan / UC Course  I have reviewed the triage vital signs and the nursing notes.  Pertinent labs & imaging results that were available during my care of the patient were reviewed by me and considered in my medical decision making (see chart for details).     URI symptoms x2 days with low-grade fever.  Most likely influenza/viral URI.  Lungs clear on exam.  Recommend symptomatic and supportive care, deferring Tamiflu given patient healthy, on edge of Tamiflu window and without insurance.  Symptomatic recommendations below,Discussed strict return precautions. Patient verbalized understanding and is agreeable with plan.  Final Clinical Impressions(s) / UC Diagnoses   Final diagnoses:  Influenza-like illness     Discharge Instructions     Symptoms most likely flu, symptoms  should resolve in approximately 1 week. Follow up if not improving, fever persisting or developing worsening cough, shortness of breath  1. Take a daily allergy pill/anti-histamine like Zyrtec, Claritin, or Store brand consistently for 2 weeks  2. For congestion you may try an oral decongestant like Mucinex or sudafed. You may also try intranasal flonase nasal spray or saline irrigations (neti pot, sinus cleanse)  3. For your sore throat you may try cepacol lozenges, salt water gargles, throat spray. Treatment of congestion may also help your sore throat.  4. For cough  you may try Tessalon provided or over the counter Delsym, Robitussen, Mucinex DM  5. Take Tylenol or Ibuprofen to help with pain/inflammation  6. Stay hydrated, drink plenty of fluids to keep throat coated and less irritated  Honey Tea For cough/sore throat try using a honey-based tea. Use 3 teaspoons of honey with juice squeezed from half lemon. Place shaved pieces of ginger into 1/2-1 cup of water and warm over stove top. Then mix the ingredients and repeat every 4 hours as needed.   ED Prescriptions    Medication Sig Dispense Auth. Provider   fluticasone (FLONASE) 50 MCG/ACT nasal spray Place 1-2 sprays into both nostrils daily. 16 g Wieters, Hallie C, PA-C   Cetirizine HCl 10 MG CAPS Take 1 capsule (10 mg total) by mouth daily for 10 days. 20 capsule Wieters, Hallie C, PA-C   benzonatate (TESSALON) 200 MG capsule Take 1 capsule (200 mg total) by mouth 3 (three) times daily as needed for up to 7 days for cough. 28 capsule Wieters, Hallie C, PA-C   ibuprofen (ADVIL,MOTRIN) 600 MG tablet Take 1 tablet (600 mg total) by mouth every 6 (six) hours as needed. 30 tablet Wieters, [place] C, PA-C     Controlled Substance Prescriptions [place] Controlled Substance Registry consulted? Not Applicable   Lew Dawes, [place] 03/28/18 1133
